# Patient Record
Sex: Female | Born: 1965 | Race: Black or African American | Hispanic: No | Marital: Married | Smoking: Never smoker
Health system: Southern US, Community
[De-identification: ages and names within clinical notes are randomized; demographics above are authoritative.]

## PROBLEM LIST (undated history)

## (undated) DIAGNOSIS — F419 Anxiety disorder, unspecified: Secondary | ICD-10-CM

## (undated) HISTORY — PX: BUNIONECTOMY: SHX129

## (undated) HISTORY — PX: DILATION AND CURETTAGE OF UTERUS: SHX78

---

## 1998-07-02 ENCOUNTER — Other Ambulatory Visit: Admission: RE | Admit: 1998-07-02 | Discharge: 1998-07-02 | Payer: Self-pay | Admitting: *Deleted

## 1999-07-04 ENCOUNTER — Other Ambulatory Visit: Admission: RE | Admit: 1999-07-04 | Discharge: 1999-07-04 | Payer: Self-pay | Admitting: *Deleted

## 2000-04-24 ENCOUNTER — Emergency Department (HOSPITAL_COMMUNITY): Admission: EM | Admit: 2000-04-24 | Discharge: 2000-04-24 | Payer: Self-pay | Admitting: Emergency Medicine

## 2000-04-25 ENCOUNTER — Encounter: Payer: Self-pay | Admitting: Emergency Medicine

## 2001-04-08 ENCOUNTER — Inpatient Hospital Stay (HOSPITAL_COMMUNITY): Admission: AD | Admit: 2001-04-08 | Discharge: 2001-04-12 | Payer: Self-pay | Admitting: *Deleted

## 2001-04-13 ENCOUNTER — Encounter: Admission: RE | Admit: 2001-04-13 | Discharge: 2001-05-13 | Payer: Self-pay | Admitting: *Deleted

## 2001-05-14 ENCOUNTER — Encounter: Admission: RE | Admit: 2001-05-14 | Discharge: 2001-06-13 | Payer: Self-pay | Admitting: *Deleted

## 2002-07-04 ENCOUNTER — Other Ambulatory Visit: Admission: RE | Admit: 2002-07-04 | Discharge: 2002-07-04 | Payer: Self-pay | Admitting: *Deleted

## 2003-08-11 ENCOUNTER — Other Ambulatory Visit: Admission: RE | Admit: 2003-08-11 | Discharge: 2003-08-11 | Payer: Self-pay | Admitting: *Deleted

## 2004-10-12 ENCOUNTER — Other Ambulatory Visit: Admission: RE | Admit: 2004-10-12 | Discharge: 2004-10-12 | Payer: Self-pay | Admitting: Obstetrics and Gynecology

## 2012-10-01 ENCOUNTER — Other Ambulatory Visit: Payer: Self-pay | Admitting: Obstetrics and Gynecology

## 2012-12-17 NOTE — Patient Instructions (Addendum)
   Your procedure is scheduled on:  Enter through the Main Entrance of [facility] at: Pick up the phone at the desk and dial [phone] and inform us of your arrival.  Please call this number if you have any problems the morning of surgery: [phone]  Remember: Do not eat food after midnight: Do not drink clear liquids after: Take these medicines the morning of surgery with a SIP OF WATER:  Do not wear jewelry, make-up, or FINGER nail polish No metal in your hair or on your body. Do not wear lotions, powders, perfumes. You may wear deodorant.  Please use your CHG wash as directed prior to surgery.  Do not shave anywhere for at least 12 hours prior to first CHG shower.  Do not bring valuables to the hospital. Contacts, dentures or bridgework may not be worn into surgery.  Leave suitcase in the car. After Surgery it may be brought to your room. For patients being admitted to the hospital, checkout time is 11:00am the day of discharge.  Patients discharged on the day of surgery will not be allowed to drive home.     Ashley Morales  12/19/2012   Your procedure is scheduled on:  01/06/13  Enter through the Main Entrance of [facility] at 6 AM.  Pick up the phone at the desk and dial [phone].   Call this number if you have problems the morning of surgery: [phone]   Remember:   Do not eat food:After Midnight.  Do not drink clear liquids: After Midnight.  Take these medicines the morning of surgery with A SIP OF WATER: May take Xanax if needed day of surgery or night before.   Do not wear jewelry, make-up or nail polish.  Do not wear lotions, powders, or perfumes. You may wear deodorant.  Do not shave 48 hours prior to surgery.  Do not bring valuables to the hospital.  Contacts, dentures or bridgework may not be worn into surgery.  Leave suitcase in the car. After surgery it may be brought to your room.  For patients admitted to the hospital, checkout time is  11:00 AM the day of discharge.   Patients discharged the day of surgery will not be allowed to drive home.  Name and phone number of your driver: NA  Special Instructions: Shower using CHG 2 nights before surgery and the night before surgery.  If you shower the day of surgery use CHG.  Use special wash - you have one bottle of CHG for all showers.  You should use approximately 1/3 of the bottle for each shower.   Please read over the following fact sheets that you were given: Surgical Site Infection Prevention

## 2012-12-18 ENCOUNTER — Encounter (HOSPITAL_COMMUNITY): Payer: Self-pay | Admitting: Pharmacist

## 2012-12-19 ENCOUNTER — Encounter (HOSPITAL_COMMUNITY): Payer: Self-pay

## 2012-12-19 ENCOUNTER — Encounter (HOSPITAL_COMMUNITY)
Admission: RE | Admit: 2012-12-19 | Discharge: 2012-12-19 | Disposition: A | Discharge status: Home / Self Care | Payer: Managed Care, Other (non HMO) | Source: Ambulatory Visit | Attending: Obstetrics and Gynecology | Admitting: Obstetrics and Gynecology

## 2012-12-19 DIAGNOSIS — Z01818 Encounter for other preprocedural examination: Secondary | ICD-10-CM | POA: Insufficient documentation

## 2012-12-19 DIAGNOSIS — Z01812 Encounter for preprocedural laboratory examination: Secondary | ICD-10-CM | POA: Insufficient documentation

## 2012-12-19 HISTORY — DX: Anxiety disorder, unspecified: F41.9

## 2012-12-19 LAB — CBC
HCT: 37.3 % (ref 36.0–46.0)
Hemoglobin: 12 g/dL (ref 12.0–15.0)
MCH: 29.5 pg (ref 26.0–34.0)
MCHC: 32.2 g/dL (ref 30.0–36.0)
MCV: 91.6 fL (ref 78.0–100.0)
Platelets: 374 10*3/uL (ref 150–400)
RBC: 4.07 MIL/uL (ref 3.87–5.11)
RDW: 14.2 % (ref 11.5–15.5)
WBC: 6.4 10*3/uL (ref 4.0–10.5)

## 2012-12-19 LAB — SURGICAL PCR SCREEN
MRSA, PCR: POSITIVE — AB
Staphylococcus aureus: POSITIVE — AB

## 2013-01-06 ENCOUNTER — Ambulatory Visit (HOSPITAL_COMMUNITY)
Admission: RE | Admit: 2013-01-06 | Payer: Managed Care, Other (non HMO) | Source: Ambulatory Visit | Admitting: Obstetrics and Gynecology

## 2013-01-06 ENCOUNTER — Encounter (HOSPITAL_COMMUNITY): Admission: RE | Payer: Self-pay | Source: Ambulatory Visit

## 2013-01-06 SURGERY — HYSTERECTOMY, VAGINAL, LAPAROSCOPY-ASSISTED
Anesthesia: General

## 2013-01-06 MED ORDER — CEFAZOLIN SODIUM-DEXTROSE 2-3 GM-% IV SOLR: 2.0000 g | INTRAVENOUS | Status: DC

## 2013-01-06 MED ORDER — LACTATED RINGERS IV SOLN: INTRAVENOUS | Status: DC

## 2013-01-06 MED ORDER — CEFAZOLIN SODIUM-DEXTROSE 2-3 GM-% IV SOLR
INTRAVENOUS | Status: AC
  Filled 2013-01-06: qty 50

## 2013-01-07 ENCOUNTER — Encounter (HOSPITAL_COMMUNITY): Payer: Self-pay | Admitting: Pharmacist

## 2013-01-08 ENCOUNTER — Ambulatory Visit (HOSPITAL_COMMUNITY): Payer: Managed Care, Other (non HMO) | Admitting: Anesthesiology

## 2013-01-08 ENCOUNTER — Inpatient Hospital Stay (HOSPITAL_COMMUNITY)
Admission: RE | Admit: 2013-01-08 | Discharge: 2013-01-10 | DRG: 742 | Disposition: A | Discharge status: Home / Self Care | Payer: Managed Care, Other (non HMO) | Source: Ambulatory Visit | Attending: Obstetrics and Gynecology | Admitting: Obstetrics and Gynecology

## 2013-01-08 ENCOUNTER — Encounter (HOSPITAL_COMMUNITY)
Admission: RE | Disposition: A | Discharge status: Home / Self Care | Payer: Self-pay | Source: Ambulatory Visit | Attending: Obstetrics and Gynecology

## 2013-01-08 ENCOUNTER — Encounter (HOSPITAL_COMMUNITY): Payer: Self-pay | Admitting: Anesthesiology

## 2013-01-08 ENCOUNTER — Inpatient Hospital Stay (HOSPITAL_COMMUNITY): Payer: Managed Care, Other (non HMO)

## 2013-01-08 DIAGNOSIS — R319 Hematuria, unspecified: Secondary | ICD-10-CM | POA: Diagnosis not present

## 2013-01-08 DIAGNOSIS — N920 Excessive and frequent menstruation with regular cycle: Secondary | ICD-10-CM | POA: Diagnosis present

## 2013-01-08 DIAGNOSIS — D219 Benign neoplasm of connective and other soft tissue, unspecified: Secondary | ICD-10-CM

## 2013-01-08 DIAGNOSIS — D250 Submucous leiomyoma of uterus: Principal | ICD-10-CM | POA: Diagnosis present

## 2013-01-08 DIAGNOSIS — N736 Female pelvic peritoneal adhesions (postinfective): Secondary | ICD-10-CM | POA: Diagnosis present

## 2013-01-08 DIAGNOSIS — Z5331 Laparoscopic surgical procedure converted to open procedure: Secondary | ICD-10-CM

## 2013-01-08 DIAGNOSIS — D251 Intramural leiomyoma of uterus: Secondary | ICD-10-CM | POA: Diagnosis present

## 2013-01-08 DIAGNOSIS — R188 Other ascites: Secondary | ICD-10-CM | POA: Diagnosis not present

## 2013-01-08 HISTORY — PX: ABDOMINAL HYSTERECTOMY: SHX81

## 2013-01-08 LAB — CBC
HCT: 30.1 % — ABNORMAL LOW (ref 36.0–46.0)
MCHC: 31.2 g/dL (ref 30.0–36.0)
MCHC: 32.7 g/dL (ref 30.0–36.0)
MCV: 92.9 fL (ref 78.0–100.0)
Platelets: 428 10*3/uL — ABNORMAL HIGH (ref 150–400)
RDW: 14.3 % (ref 11.5–15.5)
RDW: 14.2 % (ref 11.5–15.5)
WBC: 18.4 10*3/uL — ABNORMAL HIGH (ref 4.0–10.5)

## 2013-01-08 LAB — BASIC METABOLIC PANEL
BUN: 16 mg/dL (ref 6–23)
BUN: 15 mg/dL (ref 6–23)
CO2: 25 mEq/L (ref 19–32)
Calcium: 8.7 mg/dL (ref 8.4–10.5)
Chloride: 98 mEq/L (ref 96–112)
Creatinine, Ser: 0.76 mg/dL (ref 0.50–1.10)
Creatinine, Ser: 0.76 mg/dL (ref 0.50–1.10)
GFR calc Af Amer: >90 mL/min (ref >90)
GFR calc non Af Amer: >90 mL/min (ref >90)
Potassium: 3.8 mEq/L (ref 3.5–5.1)

## 2013-01-08 SURGERY — HYSTERECTOMY, ABDOMINAL
Anesthesia: General | Wound class: Clean Contaminated

## 2013-01-08 MED ORDER — LIDOCAINE HCL (CARDIAC) 20 MG/ML IV SOLN
INTRAVENOUS | Status: DC | PRN
  Administered 2013-01-08: 60 mg via INTRAVENOUS

## 2013-01-08 MED ORDER — KETOROLAC TROMETHAMINE 30 MG/ML IJ SOLN: 30.0000 mg | Freq: Once | INTRAMUSCULAR | Status: DC

## 2013-01-08 MED ORDER — MIDAZOLAM HCL 5 MG/5ML IJ SOLN
INTRAMUSCULAR | Status: DC | PRN
  Administered 2013-01-08: 1 mg via INTRAVENOUS

## 2013-01-08 MED ORDER — GLYCOPYRROLATE 0.2 MG/ML IJ SOLN
INTRAMUSCULAR | Status: DC | PRN
  Administered 2013-01-08: 0.6 mg via INTRAVENOUS

## 2013-01-08 MED ORDER — PROMETHAZINE HCL 25 MG/ML IJ SOLN: 6.2500 mg | INTRAMUSCULAR | Status: DC | PRN

## 2013-01-08 MED ORDER — DIPHENHYDRAMINE HCL 50 MG/ML IJ SOLN: 12.5000 mg | Freq: Four times a day (QID) | INTRAMUSCULAR | Status: DC | PRN

## 2013-01-08 MED ORDER — CEFAZOLIN SODIUM-DEXTROSE 2-3 GM-% IV SOLR
INTRAVENOUS | Status: AC
  Administered 2013-01-08: 2 g via INTRAVENOUS
  Filled 2013-01-08: qty 50

## 2013-01-08 MED ORDER — ACETAMINOPHEN 10 MG/ML IV SOLN
INTRAVENOUS | Status: AC
  Administered 2013-01-08: 1000 mg via INTRAVENOUS
  Filled 2013-01-08: qty 100

## 2013-01-08 MED ORDER — LACTATED RINGERS IV SOLN
INTRAVENOUS | Status: DC
  Administered 2013-01-08 (×3): via INTRAVENOUS

## 2013-01-08 MED ORDER — TRAMADOL HCL 50 MG PO TABS: 50.0000 mg | ORAL_TABLET | Freq: Four times a day (QID) | ORAL | Status: DC | PRN

## 2013-01-08 MED ORDER — HYDROMORPHONE HCL PF 1 MG/ML IJ SOLN
INTRAMUSCULAR | Status: AC
  Administered 2013-01-08: 0.5 mg via INTRAVENOUS
  Filled 2013-01-08: qty 1

## 2013-01-08 MED ORDER — METOCLOPRAMIDE HCL 5 MG/ML IJ SOLN
INTRAMUSCULAR | Status: AC
  Filled 2013-01-08: qty 2

## 2013-01-08 MED ORDER — ACETAMINOPHEN 10 MG/ML IV SOLN: 1000.0000 mg | Freq: Once | INTRAVENOUS | Status: DC

## 2013-01-08 MED ORDER — SCOPOLAMINE 1 MG/3DAYS TD PT72
MEDICATED_PATCH | TRANSDERMAL | Status: AC
  Administered 2013-01-08: 1.5 mg via TRANSDERMAL
  Filled 2013-01-08: qty 1

## 2013-01-08 MED ORDER — TEMAZEPAM 15 MG PO CAPS: 15.0000 mg | ORAL_CAPSULE | Freq: Every evening | ORAL | Status: DC | PRN

## 2013-01-08 MED ORDER — IBUPROFEN 600 MG PO TABS: 600.0000 mg | ORAL_TABLET | Freq: Four times a day (QID) | ORAL | Status: DC | PRN

## 2013-01-08 MED ORDER — DEXAMETHASONE SODIUM PHOSPHATE 10 MG/ML IJ SOLN
INTRAMUSCULAR | Status: AC
  Filled 2013-01-08: qty 1

## 2013-01-08 MED ORDER — HYDROMORPHONE 0.3 MG/ML IV SOLN
INTRAVENOUS | Status: DC
  Administered 2013-01-08: 15:00:00 via INTRAVENOUS
  Administered 2013-01-08: 0.799 mg via INTRAVENOUS
  Administered 2013-01-08: 0.4 mg via INTRAVENOUS
  Administered 2013-01-09: 0.399 mg via INTRAVENOUS
  Administered 2013-01-09: 0.3 mg via INTRAVENOUS
  Administered 2013-01-09: 0.2 mg via INTRAVENOUS
  Filled 2013-01-08: qty 25

## 2013-01-08 MED ORDER — ALPRAZOLAM 0.25 MG PO TABS: 0.2500 mg | ORAL_TABLET | Freq: Three times a day (TID) | ORAL | Status: DC | PRN

## 2013-01-08 MED ORDER — DEXTROSE IN LACTATED RINGERS 5 % IV SOLN
INTRAVENOUS | Status: DC
  Administered 2013-01-08 – 2013-01-09 (×3): via INTRAVENOUS

## 2013-01-08 MED ORDER — NEOSTIGMINE METHYLSULFATE 1 MG/ML IJ SOLN
INTRAMUSCULAR | Status: AC
  Filled 2013-01-08: qty 1

## 2013-01-08 MED ORDER — FENTANYL CITRATE 0.05 MG/ML IJ SOLN
INTRAMUSCULAR | Status: AC
  Filled 2013-01-08: qty 5

## 2013-01-08 MED ORDER — PROMETHAZINE HCL 25 MG/ML IJ SOLN
INTRAMUSCULAR | Status: AC
  Administered 2013-01-08: 6.25 mg via INTRAVENOUS
  Filled 2013-01-08: qty 1

## 2013-01-08 MED ORDER — PROPOFOL 10 MG/ML IV EMUL
INTRAVENOUS | Status: AC
  Filled 2013-01-08: qty 20

## 2013-01-08 MED ORDER — LACTATED RINGERS IV BOLUS (SEPSIS): 500.0000 mL | Freq: Once | INTRAVENOUS | Status: DC

## 2013-01-08 MED ORDER — HYDROMORPHONE HCL PF 1 MG/ML IJ SOLN
0.2500 mg | INTRAMUSCULAR | Status: DC | PRN
  Administered 2013-01-08: 0.5 mg via INTRAVENOUS

## 2013-01-08 MED ORDER — GLYCOPYRROLATE 0.2 MG/ML IJ SOLN
INTRAMUSCULAR | Status: AC
  Filled 2013-01-08: qty 4

## 2013-01-08 MED ORDER — LIDOCAINE HCL (CARDIAC) 20 MG/ML IV SOLN
INTRAVENOUS | Status: AC
  Filled 2013-01-08: qty 5

## 2013-01-08 MED ORDER — ROCURONIUM BROMIDE 100 MG/10ML IV SOLN
INTRAVENOUS | Status: DC | PRN
  Administered 2013-01-08: 35 mg via INTRAVENOUS

## 2013-01-08 MED ORDER — 0.9 % SODIUM CHLORIDE (POUR BTL) OPTIME
TOPICAL | Status: DC | PRN
  Administered 2013-01-08: 1000 mL

## 2013-01-08 MED ORDER — ONDANSETRON HCL 4 MG/2ML IJ SOLN
INTRAMUSCULAR | Status: DC | PRN
  Administered 2013-01-08: 4 mg via INTRAVENOUS

## 2013-01-08 MED ORDER — PHENYLEPHRINE 40 MCG/ML (10ML) SYRINGE FOR IV PUSH (FOR BLOOD PRESSURE SUPPORT)
PREFILLED_SYRINGE | INTRAVENOUS | Status: AC
  Filled 2013-01-08: qty 5

## 2013-01-08 MED ORDER — METOCLOPRAMIDE HCL 5 MG/ML IJ SOLN
INTRAMUSCULAR | Status: DC | PRN
  Administered 2013-01-08: 10 mg via INTRAVENOUS

## 2013-01-08 MED ORDER — ONDANSETRON HCL 4 MG/2ML IJ SOLN
INTRAMUSCULAR | Status: AC
  Filled 2013-01-08: qty 2

## 2013-01-08 MED ORDER — DIPHENHYDRAMINE HCL 12.5 MG/5ML PO ELIX: 12.5000 mg | ORAL_SOLUTION | Freq: Four times a day (QID) | ORAL | Status: DC | PRN

## 2013-01-08 MED ORDER — MENTHOL 3 MG MT LOZG: 1.0000 | LOZENGE | OROMUCOSAL | Status: DC | PRN

## 2013-01-08 MED ORDER — NEOSTIGMINE METHYLSULFATE 1 MG/ML IJ SOLN
INTRAMUSCULAR | Status: DC | PRN
  Administered 2013-01-08: 3 mg via INTRAVENOUS

## 2013-01-08 MED ORDER — SODIUM CHLORIDE 0.9 % IJ SOLN: 9.0000 mL | INTRAMUSCULAR | Status: DC | PRN

## 2013-01-08 MED ORDER — PROPOFOL 10 MG/ML IV EMUL
INTRAVENOUS | Status: DC | PRN
  Administered 2013-01-08: 200 mg via INTRAVENOUS

## 2013-01-08 MED ORDER — BUPIVACAINE HCL (PF) 0.25 % IJ SOLN
INTRAMUSCULAR | Status: AC
  Filled 2013-01-08: qty 30

## 2013-01-08 MED ORDER — NALOXONE HCL 0.4 MG/ML IJ SOLN: 0.4000 mg | INTRAMUSCULAR | Status: DC | PRN

## 2013-01-08 MED ORDER — MIDAZOLAM HCL 2 MG/2ML IJ SOLN
INTRAMUSCULAR | Status: AC
  Filled 2013-01-08: qty 2

## 2013-01-08 MED ORDER — BUPIVACAINE HCL (PF) 0.25 % IJ SOLN
INTRAMUSCULAR | Status: DC | PRN
  Administered 2013-01-08: 15 mL

## 2013-01-08 MED ORDER — DEXAMETHASONE SODIUM PHOSPHATE 4 MG/ML IJ SOLN
INTRAMUSCULAR | Status: DC | PRN
  Administered 2013-01-08: 8 mg via INTRAVENOUS

## 2013-01-08 MED ORDER — SCOPOLAMINE 1 MG/3DAYS TD PT72: 1.0000 | MEDICATED_PATCH | TRANSDERMAL | Status: DC

## 2013-01-08 MED ORDER — ONDANSETRON HCL 4 MG/2ML IJ SOLN
4.0000 mg | Freq: Four times a day (QID) | INTRAMUSCULAR | Status: DC | PRN
  Administered 2013-01-08 – 2013-01-10 (×2): 4 mg via INTRAVENOUS
  Filled 2013-01-08 (×2): qty 2

## 2013-01-08 MED ORDER — FENTANYL CITRATE 0.05 MG/ML IJ SOLN
INTRAMUSCULAR | Status: DC | PRN
  Administered 2013-01-08: 50 ug via INTRAVENOUS
  Administered 2013-01-08 (×2): 100 ug via INTRAVENOUS
  Administered 2013-01-08: 50 ug via INTRAVENOUS
  Administered 2013-01-08: 100 ug via INTRAVENOUS
  Administered 2013-01-08: 50 ug via INTRAVENOUS

## 2013-01-08 MED ORDER — ROCURONIUM BROMIDE 50 MG/5ML IV SOLN
INTRAVENOUS | Status: AC
  Filled 2013-01-08: qty 1

## 2013-01-08 SURGICAL SUPPLY — 42 items
BARRIER ADHS 3X4 INTERCEED (GAUZE/BANDAGES/DRESSINGS) IMPLANT
CABLE HIGH FREQUENCY MONO STRZ (ELECTRODE) IMPLANT
CHLORAPREP W/TINT 26ML (MISCELLANEOUS) ×2 IMPLANT
CLOTH BEACON ORANGE TIMEOUT ST (SAFETY) ×2 IMPLANT
CONT PATH 16OZ SNAP LID 3702 (MISCELLANEOUS) ×2 IMPLANT
COVER MAYO STAND STRL (DRAPES) ×2 IMPLANT
COVER TABLE BACK 60X90 (DRAPES) ×2 IMPLANT
DECANTER SPIKE VIAL GLASS SM (MISCELLANEOUS) IMPLANT
DERMABOND ADVANCED (GAUZE/BANDAGES/DRESSINGS) ×1
DERMABOND ADVANCED .7 DNX12 (GAUZE/BANDAGES/DRESSINGS) ×1 IMPLANT
DRSG OPSITE POSTOP 4X10 (GAUZE/BANDAGES/DRESSINGS) ×2 IMPLANT
ELECT REM PT RETURN 9FT ADLT (ELECTROSURGICAL) ×2
ELECTRODE REM PT RTRN 9FT ADLT (ELECTROSURGICAL) ×1 IMPLANT
EVACUATOR SMOKE 8.L (FILTER) IMPLANT
GLOVE BIO SURGEON STRL SZ 6.5 (GLOVE) ×2 IMPLANT
GLOVE BIOGEL PI IND STRL 6.5 (GLOVE) ×1 IMPLANT
GLOVE BIOGEL PI INDICATOR 6.5 (GLOVE) ×1
GOWN STRL REIN XL XLG (GOWN DISPOSABLE) ×8 IMPLANT
NEEDLE INSUFFLATION 120MM (ENDOMECHANICALS) ×2 IMPLANT
NS IRRIG 1000ML POUR BTL (IV SOLUTION) ×2 IMPLANT
PACK LAVH (CUSTOM PROCEDURE TRAY) ×2 IMPLANT
PROTECTOR NERVE ULNAR (MISCELLANEOUS) ×2 IMPLANT
SEALER TISSUE G2 CVD JAW 45CM (ENDOMECHANICALS) IMPLANT
SET IRRIG TUBING LAPAROSCOPIC (IRRIGATION / IRRIGATOR) IMPLANT
SOLUTION ELECTROLUBE (MISCELLANEOUS) IMPLANT
SPONGE LAP 18X18 X RAY DECT (DISPOSABLE) ×6 IMPLANT
STAPLER VISISTAT 35W (STAPLE) ×2 IMPLANT
SUT PLAIN 2 0 (SUTURE) ×1
SUT PLAIN ABS 2-0 CT1 27XMFL (SUTURE) ×1 IMPLANT
SUT VIC AB 0 CT1 18XCR BRD8 (SUTURE) ×2 IMPLANT
SUT VIC AB 0 CT1 36 (SUTURE) ×6 IMPLANT
SUT VIC AB 0 CT1 8-18 (SUTURE) ×2
SUT VIC AB 3-0 PS2 18 (SUTURE)
SUT VIC AB 3-0 PS2 18XBRD (SUTURE) IMPLANT
SUT VICRYL 0 TIES 12 18 (SUTURE) ×2 IMPLANT
SUT VICRYL 0 UR6 27IN ABS (SUTURE) ×2 IMPLANT
TOWEL OR 17X24 6PK STRL BLUE (TOWEL DISPOSABLE) ×4 IMPLANT
TRAY FOLEY CATH 14FR (SET/KITS/TRAYS/PACK) ×2 IMPLANT
TROCAR OPTI TIP 5M 100M (ENDOMECHANICALS) ×2 IMPLANT
TROCAR XCEL DIL TIP R 11M (ENDOMECHANICALS) ×2 IMPLANT
WARMER LAPAROSCOPE (MISCELLANEOUS) ×2 IMPLANT
WATER STERILE IRR 1000ML POUR (IV SOLUTION) ×2 IMPLANT

## 2013-01-08 NOTE — Progress Notes (Signed)
Ur chart review completed.  

## 2013-01-08 NOTE — OR Nursing (Signed)
1245 dr Vincente Poli comes to assess pt. Pt stable  Vital signs stable. Orders received to draw cbc and bmet. Pt alert oriented.

## 2013-01-08 NOTE — Progress Notes (Signed)
Came to evaluate patient in recovery room. Urine changed from clear to cherry color in the recovery room. It was clear in the OR Patient has had excellent response to the fluid bolus and continues to have very stable vital signs She reports normal amount of pain and minimal nausea  Exam Abdomen is soft and slightly distended Not rigid Incision is clear and dry no oozing with application of pressure No vaginal bleeding uop continues to be a cherry color - very watery  Impression; Hematuria post op Hemodynamically stable i would like to get a stat cbc and cmet Fluid bolus Foley already changed Monitor closely No evidence of intraperitoneal bleed will look at cbc though

## 2013-01-08 NOTE — Progress Notes (Signed)
Today, the patient is stable. Pain 8 out of 10 Afebrile VSS UOP is great since and is less red now but still pink  Abdomen is slightly distended Incision is clean dry and intact Some oozing from the infraumbilical site but i cannot express any drainage.   Hemoglobin is stable at 9.1 Which was 9.4 at lunchtime  Ultrasound at bedside Small amount of fluid along the RUQ but not a lot  Difficult to visualize any fluid in the cul de sac No vaginal bleeding  IMPRESSION: Post op hematuria Stable hemoglobin  PLAN: Monitor uop Ultrasound now to check the ureters BMET

## 2013-01-08 NOTE — OR Nursing (Signed)
10:25 pt urine went from lt pink tinged to cherry in color. Pt vital signs stable. Pt resting quielty.  250cc urine emptied from pt foley bag. Called to dr. Vincente Poli to inform of change of urine color. Orders received to give 500cc ns bolus iv and irrigate pt cathetor with normal saline. Call dr Vincente Poli to let know status after orders carried out. Margarita Mail rn

## 2013-01-08 NOTE — Transfer of Care (Signed)
Immediate Anesthesia Transfer of Care Note  Patient: Ashley Morales  Procedure(s) Performed: Procedure(s): HYSTERECTOMY ABDOMINAL (N/A)  Patient Location: PACU  Anesthesia Type:General  Level of Consciousness: awake, alert  and patient cooperative  Airway & Oxygen Therapy: Patient Spontanous Breathing and Patient connected to nasal cannula oxygen  Post-op Assessment: Report given to PACU RN and Post -op Vital signs reviewed and stable  Post vital signs: stable  Complications: No apparent anesthesia complications

## 2013-01-08 NOTE — Brief Op Note (Signed)
01/08/2013  8:50 AM  PATIENT:  Ashley Morales  47 y.o. female  PRE-OPERATIVE DIAGNOSIS:  Fibroids  POST-OPERATIVE DIAGNOSIS:  Fibroids, dense pelvic adhesions  PROCEDURE:  Procedure(s): HYSTERECTOMY ABDOMINAL (N/A) Diagnostic Laparoscopy Lysis of adhesions   SURGEON:  Surgeon(s) and Role:    * Jeani Hawking, MD - Primary    * W Varney Baas, MD - Assisting  PHYSICIAN ASSISTANT:   ASSISTANTS: none   ANESTHESIA:   general  EBL:  Total I/O In: 2000 [I.V.:2000] Out: 325 [Urine:125; Blood:200]  BLOOD ADMINISTERED:none  DRAINS: Urinary Catheter (Foley)   LOCAL MEDICATIONS USED:  XYLOCAINE   SPECIMEN:  Source of Specimen:  uterus and cervix  DISPOSITION OF SPECIMEN:  PATHOLOGY  COUNTS:  YES  TOURNIQUET:  * No tourniquets in log *  DICTATION: .Other Dictation: Dictation Number [id number]  PLAN OF CARE: Admit to inpatient   PATIENT DISPOSITION:  PACU - hemodynamically stable.   Delay start of Pharmacological VTE agent (>24hrs) due to surgical blood loss or risk of bleeding: not applicable

## 2013-01-08 NOTE — Anesthesia Postprocedure Evaluation (Signed)
Anesthesia Post Note  Patient: Ashley Morales  Procedure(s) Performed: Procedure(s) (LRB): HYSTERECTOMY ABDOMINAL (N/A)  Anesthesia type: General  Patient location: PACU  Post pain: Pain level controlled  Post assessment: Post-op Vital signs reviewed  Last Vitals:  Filed Vitals:   01/08/13 0615  BP: 110/66  Pulse: 71  Temp: 36.5 C    Post vital signs: Reviewed  Level of consciousness: sedated  Complications: No apparent anesthesia complications

## 2013-01-08 NOTE — Progress Notes (Signed)
47 year old G 2 P 2  with symptomatic fibroids. Complains of menorrhagia. Ultrasound is consistent with multiple fibroids. 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

## 2013-01-08 NOTE — Anesthesia Preprocedure Evaluation (Signed)

## 2013-01-09 ENCOUNTER — Encounter (HOSPITAL_COMMUNITY): Payer: Self-pay | Admitting: *Deleted

## 2013-01-09 LAB — BASIC METABOLIC PANEL
GFR calc Af Amer: >90 mL/min (ref >90)
GFR calc non Af Amer: >90 mL/min (ref >90)
Potassium: 4.4 mEq/L (ref 3.5–5.1)
Sodium: 137 mEq/L (ref 135–145)

## 2013-01-09 LAB — CBC
HCT: 21.6 % — ABNORMAL LOW (ref 36.0–46.0)
MCH: 29.3 pg (ref 26.0–34.0)
MCV: 90.4 fL (ref 78.0–100.0)
Platelets: 309 10*3/uL (ref 150–400)
RBC: 2.39 MIL/uL — ABNORMAL LOW (ref 3.87–5.11)
WBC: 11.9 10*3/uL — ABNORMAL HIGH (ref 4.0–10.5)

## 2013-01-09 MED ORDER — GLYCERIN (LAXATIVE) 2.1 G RE SUPP
1.0000 | Freq: Once | RECTAL | Status: AC
  Administered 2013-01-09: 21:00:00 via RECTAL
  Filled 2013-01-09 (×2): qty 1

## 2013-01-09 MED ORDER — FERUMOXYTOL INJECTION 510 MG/17 ML
510.0000 mg | Freq: Once | INTRAVENOUS | Status: AC
  Administered 2013-01-09: 510 mg via INTRAVENOUS
  Filled 2013-01-09: qty 17

## 2013-01-09 MED ORDER — IBUPROFEN 100 MG/5ML PO SUSP
600.0000 mg | Freq: Four times a day (QID) | ORAL | Status: DC | PRN
  Administered 2013-01-10: 600 mg via ORAL
  Filled 2013-01-09: qty 30

## 2013-01-09 MED ORDER — OXYCODONE HCL 5 MG/5ML PO SOLN
10.0000 mg | ORAL | Status: DC | PRN
  Administered 2013-01-09 – 2013-01-10 (×2): 10 mg via ORAL
  Filled 2013-01-09 (×2): qty 10

## 2013-01-09 MED ORDER — HYDROMORPHONE HCL 1 MG/ML PO LIQD: 2.0000 mg | ORAL | Status: DC | PRN

## 2013-01-09 NOTE — Progress Notes (Signed)
1 Day Post-Op Procedure(s) (LRB): HYSTERECTOMY ABDOMINAL (N/A)  Subjective: Patient reports incisional pain, tolerating PO and + flatus.    Objective: I have reviewed patient's vital signs, intake and output, medications and labs.  Urine output is clear  General: alert, cooperative and appears stated age Vaginal Bleeding: none Abdomen is soft and non tender and incision is clean, dry and intact  Assessment: s/p Procedure(s): HYSTERECTOMY ABDOMINAL (N/A): progressing well and tolerating diet  Plan: Advance diet Encourage ambulation Advance to PO medication Discontinue IV fluids Continue foley due to urinary output monitoring cause of hematuria post op not known  - may have been secondary to extensive dissection around bladder. I would like for patient to wear Foley for one week and I discussed this with the patient  LOS: 1 day    Ashley Morales L 01/09/2013, 9:56 AM

## 2013-01-09 NOTE — Op Note (Signed)
NAMEKEIRA, BOHLIN         ACCOUNT NO.:  [id number]  MEDICAL RECORD NO.:  [id number]  LOCATION:  9305                          FACILITY:  [facility]  PHYSICIAN:  Lorimer Tiberio L. Yifan Auker, M.D.DATE OF BIRTH:  10/18/1965  DATE OF PROCEDURE:  01/08/2013 DATE OF DISCHARGE:                              OPERATIVE REPORT   PREOPERATIVE DIAGNOSIS:  Symptomatic fibroids.  POSTOPERATIVE DIAGNOSES: 1. Symptomatic fibroids. 2. Dense pelvic adhesions.  PROCEDURES: 1. Diagnostic laparoscopy with conversion to total abdominal     hysterectomy. 2. Extensive lysis of adhesions.  SURGEON:  Isbella Arline L. Vincente Poli, MD  ASSISTANT:  Freddy Finner, MD  ANESTHESIA:  General.  ESTIMATED BLOOD LOSS:  200 mL.  URINE OUTPUT:  125 mL, clear urine.  DESCRIPTION OF PROCEDURE:  The patient was taken to the operating room after she had been consented about all the risk associated with the procedure.  She is well aware of the possibility of laparotomy because of 2 prior cesarean sections and the size of her uterus.  After she was intubated, she was prepped and draped.  A uterine manipulator was inserted.  Attention was turned to the abdomen.  A Foley catheter also had been inserted and was draining clear urine.  A small infraumbilical incision was made.  A Veress needle was inserted and pneumoperitoneum was performed.  The trocar was then inserted and with excellent visualization, we could see that the uterus was markedly enlarged and myomatous.  Also, that the entire anterior surface of the uterus was densely adherent to the abdominal wall, most likely from her previous cesarean section.  I felt that there was no way that we would be able to safely take this adhesion down laparoscopically, so we converted to TAH. We removed the trocar, released the pneumoperitoneum, closed the incision with Dermabond.  Then, we made a small Pfannenstiel incision at the area of the previous C-section scar, carried it down to  the fascia. There was dense scar tissue in the subcutaneous layer.  We carefully went down to the fascia, scored that with a knife, and entered the fascia.  We then dissected the rectus muscles away sharply using Mayo scissors superiorly and inferiorly.  We could find a defect where I had inserted the trocar and so we our placed 2 fingers in there and we were able to take down a lot of adhesions sharply with careful attention. Once we saw the fundus, we grasped that with a towel clamp and pulled that out and we were able to use some traction and pulled these adhesions down with sharp and blunt dissection.  The adnexa were normal. What I did then is, I have then placed curved Heaney clamps across the triple pedicle on the right side, clamped that, cut it, and suture ligated x2 with 0 Vicryl suture.  We did this on the right and then on the left.  We then took down some of the adhesions sharply.  We were then unable to identify the uterine artery and clamped at the level of the internal os bilaterally.  Each clamp was clamped, cut, and suture ligated using 0 Vicryl suture.  We then amputated the fundus to make it easier to work on the cervix.  This was done easily with a knife.  We then used sharp dissection.  We were able to identify the bladder.  We dissected out away from the anterior surface of the cervix, placed straight Heaney clamps along the cervix.  Each pedicle was clamped, cut, and suture ligated.  We walked our way down the cervix, with each clamps staying snug beside the cervix.  Each pedicle was clamped, cut, and suture ligated using 0 Vicryl suture.  Once we reached the external os and noted her bladder was well below this.  We then placed curved Heaney clamps across that.  The cervix was then removed.  The angle stitches were placed using 0 Vicryl suture.  Remainder of the cervix was closed using 0 Vicryl in interrupted.  Hemostasis was excellent.  Irrigation was performed.   Hemostasis was again noted.  The peritoneum and rectus muscles were reapproximated using 0 Vicryl interrupted.  The fascia was closed using 0 Vicryl, starting at a corner in running stitch.  After irrigation of the subcutaneous layer, the subcu was closed with plain gut interrupted.  The skin was closed with staples.  All sponge, lap, instrument counts were correct x2.  The patient went to the recovery room in stable condition.     Chris Cripps L. Vincente Poli, M.D.     Florestine Avers  D:  01/08/2013  T:  01/09/2013  Job:  [id number]

## 2013-01-10 LAB — CBC
MCH: 29.7 pg (ref 26.0–34.0)
MCHC: 32.2 g/dL (ref 30.0–36.0)
MCV: 92.2 fL (ref 78.0–100.0)
Platelets: 317 10*3/uL (ref 150–400)
RDW: 14.5 % (ref 11.5–15.5)

## 2013-01-10 LAB — BASIC METABOLIC PANEL
BUN: 10 mg/dL (ref 6–23)
CO2: 30 mEq/L (ref 19–32)
Calcium: 8.6 mg/dL (ref 8.4–10.5)
Creatinine, Ser: 0.7 mg/dL (ref 0.50–1.10)
GFR calc non Af Amer: >90 mL/min (ref >90)
Glucose, Bld: 111 mg/dL — ABNORMAL HIGH (ref 70–99)

## 2013-01-10 MED ORDER — IBUPROFEN 100 MG/5ML PO SUSP: 600.0000 mg | Freq: Four times a day (QID) | ORAL | Status: DC | PRN

## 2013-01-10 MED ORDER — GLYCERIN (LAXATIVE) 2.1 G RE SUPP
1.0000 | Freq: Once | RECTAL | Status: AC
  Administered 2013-01-10: 1 via RECTAL
  Filled 2013-01-10: qty 1

## 2013-01-10 MED ORDER — ONDANSETRON 8 MG PO TBDP: 8.0000 mg | ORAL_TABLET | Freq: Three times a day (TID) | ORAL | Status: DC | PRN

## 2013-01-10 MED ORDER — MAGNESIUM HYDROXIDE 400 MG/5ML PO SUSP
15.0000 mL | Freq: Every day | ORAL | Status: DC | PRN
  Administered 2013-01-10: 15 mL via ORAL
  Filled 2013-01-10: qty 30

## 2013-01-10 MED ORDER — HYDROMORPHONE HCL 1 MG/ML PO LIQD: 2.0000 mg | ORAL | Status: DC | PRN

## 2013-01-10 NOTE — Progress Notes (Signed)
2 Days Post-Op Procedure(s) (LRB): HYSTERECTOMY ABDOMINAL (N/A)  Subjective: Patient reports tolerating PO and + flatus.  Is passing a lot of gas. Tolerating regular diet.  Objective: I have reviewed patient's vital signs, intake and output, medications and labs.  General: alert, cooperative and appears stated age Vaginal Bleeding: none Abdomen is slightly distended and non tender Great bowel sounds  Assessment: s/p Procedure(s): HYSTERECTOMY ABDOMINAL (N/A): stable and progressing well  Plan: Advance diet Discharge home Dulcolax suppository today Discharge home with Foley and leg bag  Will return on Thursday to have catheter and staples removed.  LOS: 2 days    Ashley Morales 01/10/2013, 8:25 AM

## 2013-01-10 NOTE — Discharge Summary (Signed)
Admission Diagnosis: Symptomatic Fibroids  Discharge Diagnosis: Same  Hospital Course:  47 year old underwent Diagnostic laparoscopy, TAH and extensive lysis of adhesions. At the time of completion of the surgery, the urine in the Foley was clear. However, when the patient arrived to the recovery room, her urine was cherry colored. Urine output and vital signs were always excellent. The urine started to lighten up about 10 hours after surgery. During that time, we monitored the patient and CBC and CMET were both stable. A bedside ultrasound was performed which showed minimal fluid in the abdominal cavity and normal size ureters. The etiology of the hematuria was unclear and believed to be either a small incidental cystotomy or from the trauma to the bladder from the extensive dissection that was necessary. After 10 hours, the urine remained clear and drainage was always excellent. I felt that wearing the Foley for one week was necessary to allow the bladder time to heal.  On POD #1, the hemoglobin was 7 a small drop from POD #0. I do think the patient had some post op oozing but she remained very stable. We advanced her diet very slowly because of concern for an ileus. By POD #1 she was passing a lot of flatus. Her pain had decreased.   She was discharged home in good condition on POD #2. She was given very strict ileus precautions. She was instructed on Foley care. She will return to my office on Thursday to remove the Foley and the staples.  She was given po Dilaudid, Ibuprofen and Zofran to use prn  No driving for 1 week No intercourse for 6 weeks.

## 2013-11-11 ENCOUNTER — Other Ambulatory Visit: Payer: Self-pay | Admitting: Obstetrics and Gynecology

## 2014-05-04 IMAGING — US US ABDOMEN LIMITED
1 series · 14 of 20 positions shown · non-contrast
Comparison: None.

CLINICAL DATA: Hematuria after abdominal hysterectomy earlier
today.

LIMITED ABDOMINAL ULTRASOUND

[Series 1: us abdomen complete · 20 acquisitions, 14 frames shown]
[im 1/20]
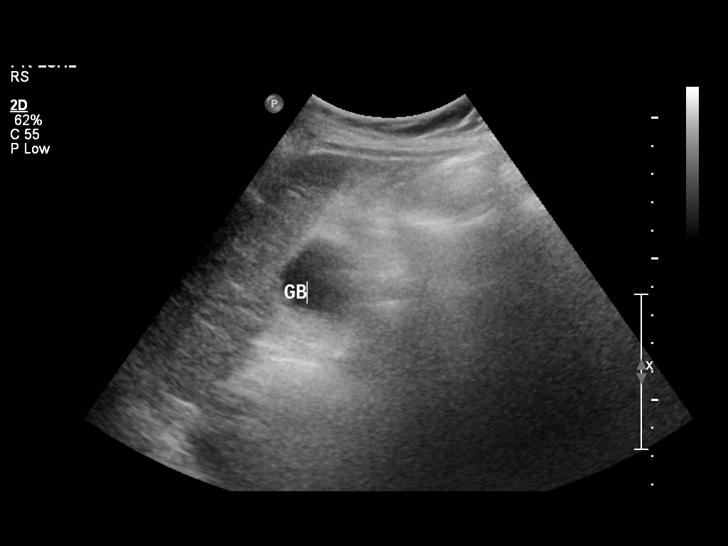
[im 3/20]
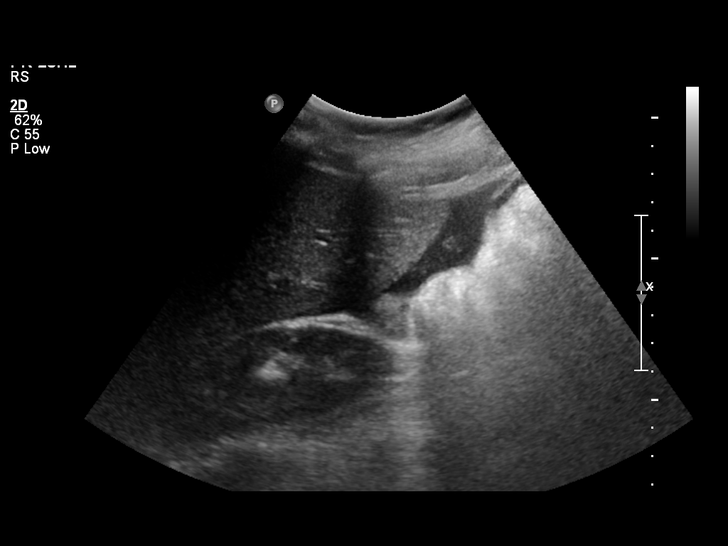
[im 4/20]
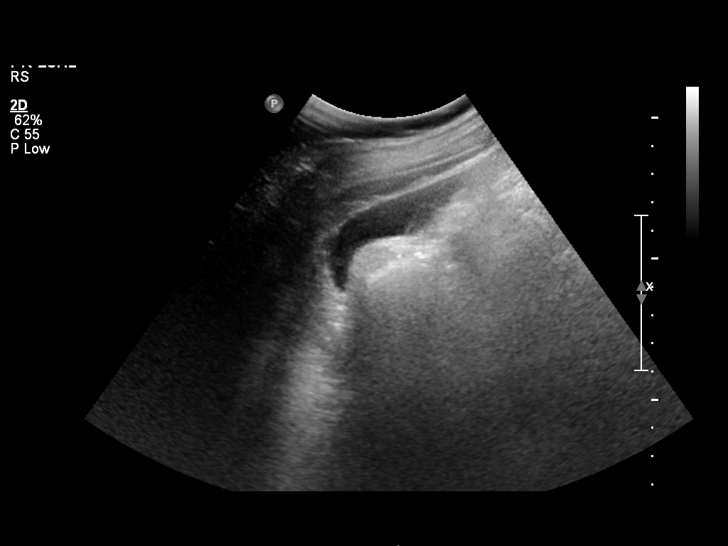
[im 6/20]
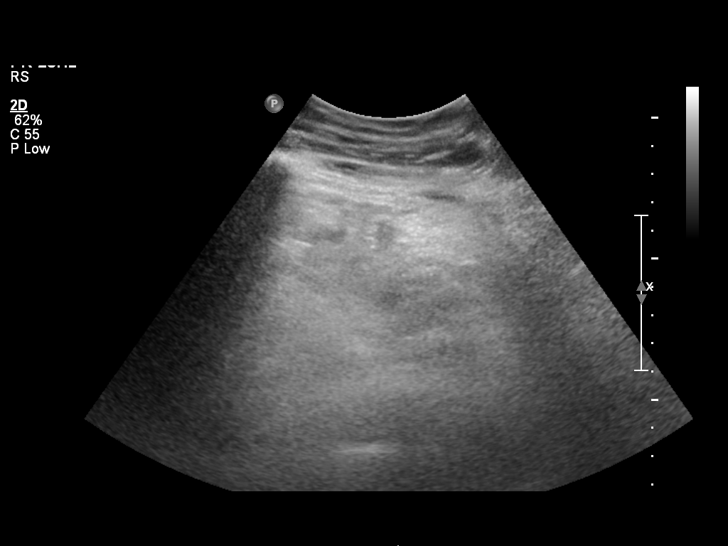
[im 7/20]
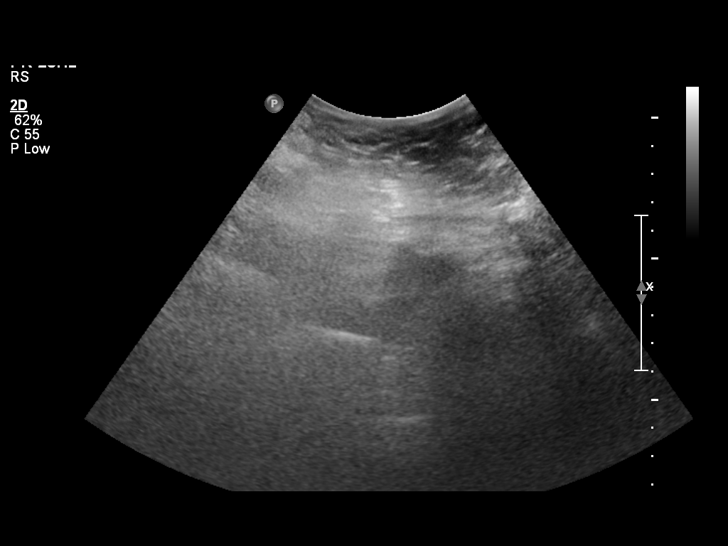
[im 8/20]
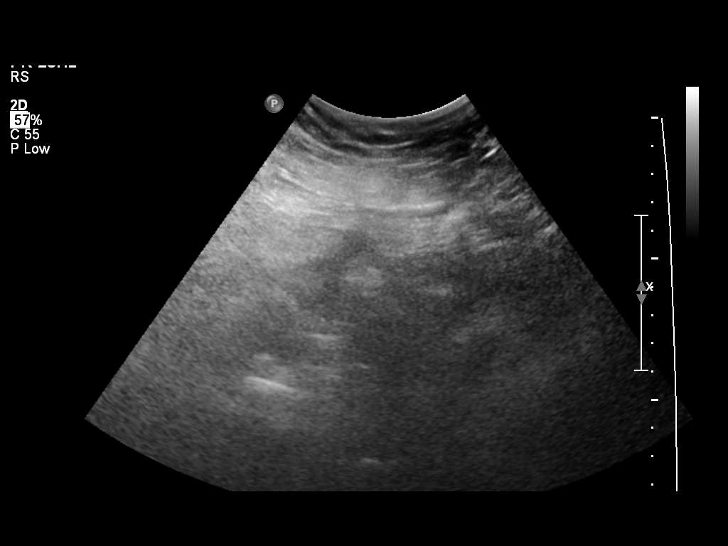
[im 10/20]
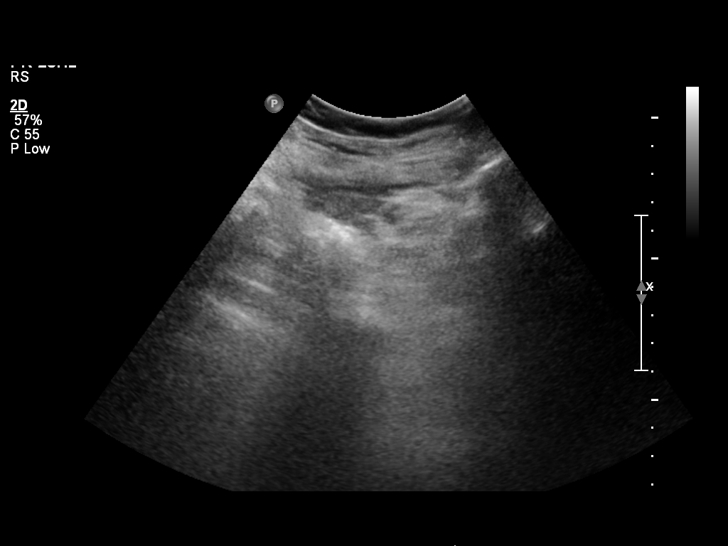
[im 11/20]
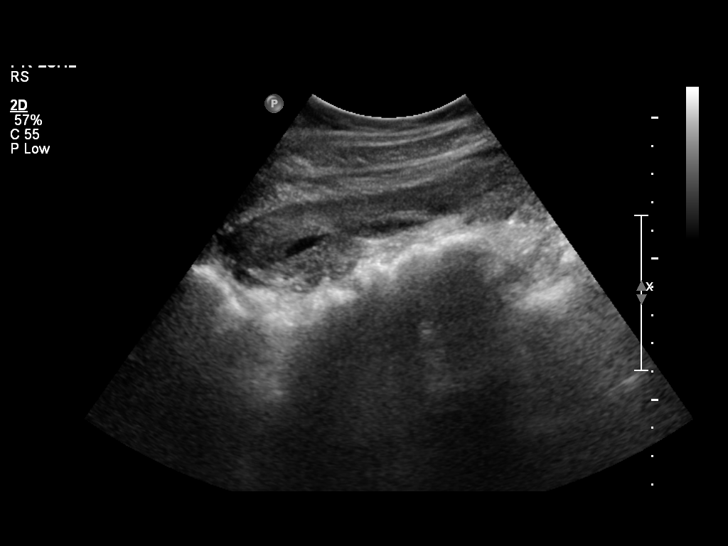
[im 13/20]
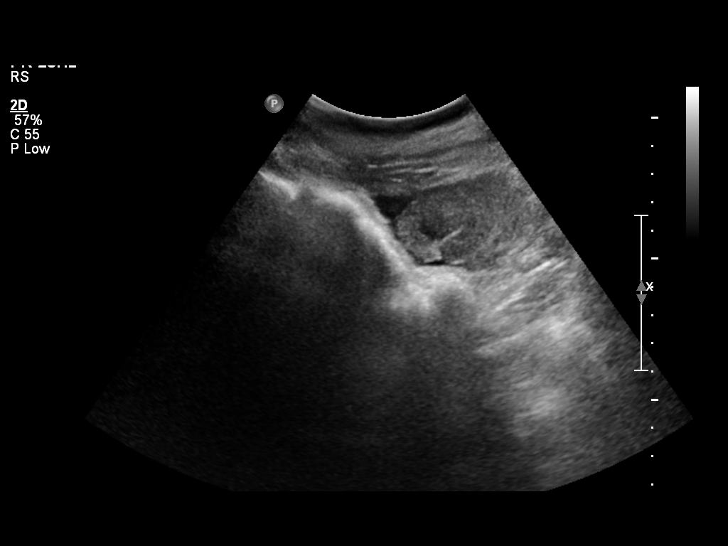
[im 14/20]
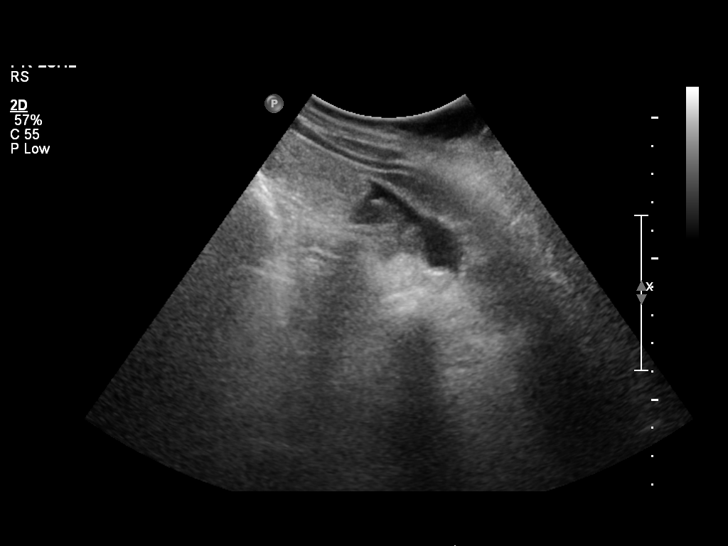
[im 16/20]
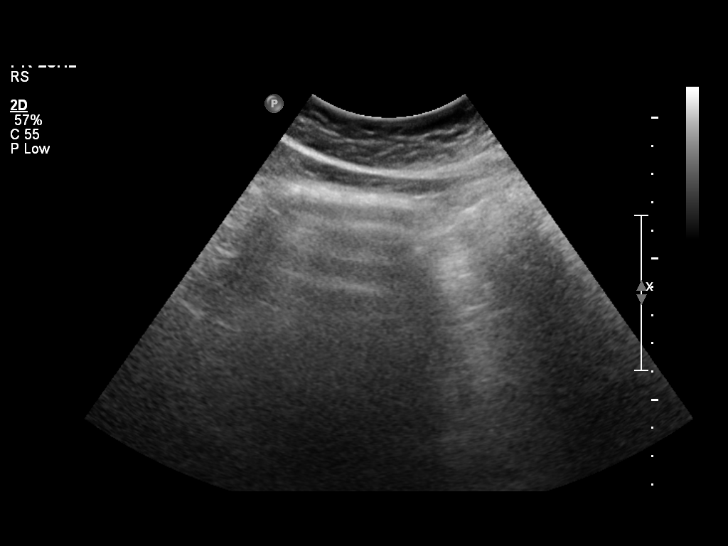
[im 17/20]
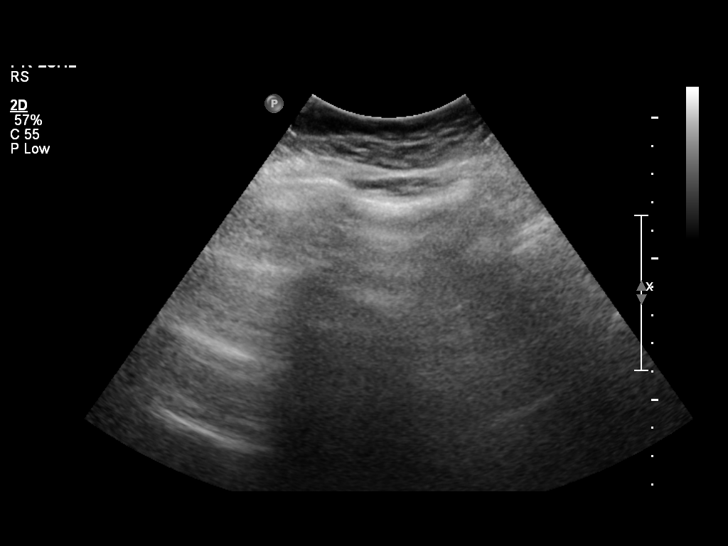
[im 18/20]
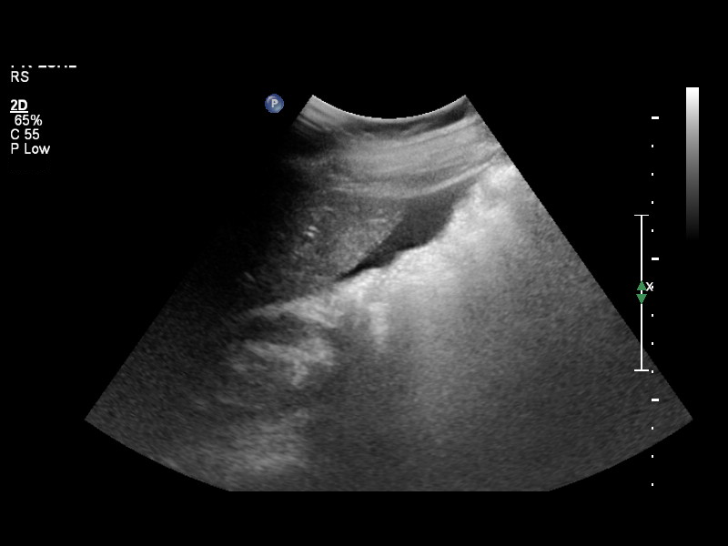
[im 20/20]
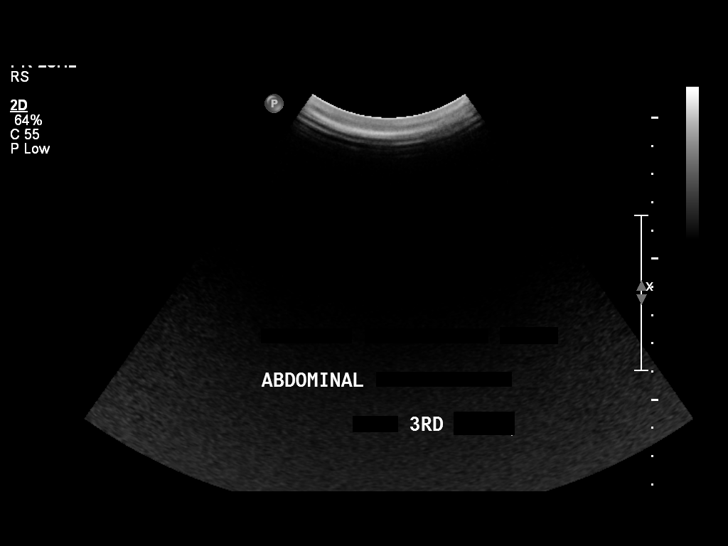

[14 of 20 positions shown; findings below may reference images not displayed]

FINDINGS: Small amount of fluid is seen around the liver.  Four-
quadrant assessment shows a tiny amount of fluid in the left upper
quadrant and both pericolic gutters.  No substantial free fluid is
seen in the lower quadrants.
IMPRESSION: Small volume of intraperitoneal free fluid evident.  Pelvic anatomy
not well visualized secondary to overlying obscuring bowel gas.  No
gross fluid can be identified.  If there is high clinical concern
for intraperitoneal bleeding, CT imaging would be a more sensitive
means to evaluate.

I discussed the results of this study with Dr. Tiger at

## 2014-05-04 IMAGING — US US RENAL
1 series · 14 of 25 positions shown · non-contrast
Comparison: No comparison studies available.

CLINICAL DATA: Gross hematuria after abdominal hysterectomy.

RENAL / URINARY TRACT ULTRASOUND
TECHNIQUE: Complete ultrasound exam of the kidneys and urinary
bladder was performed.

[Series 1: us renal · 14 of 34 slices shown]
[im 1/34]
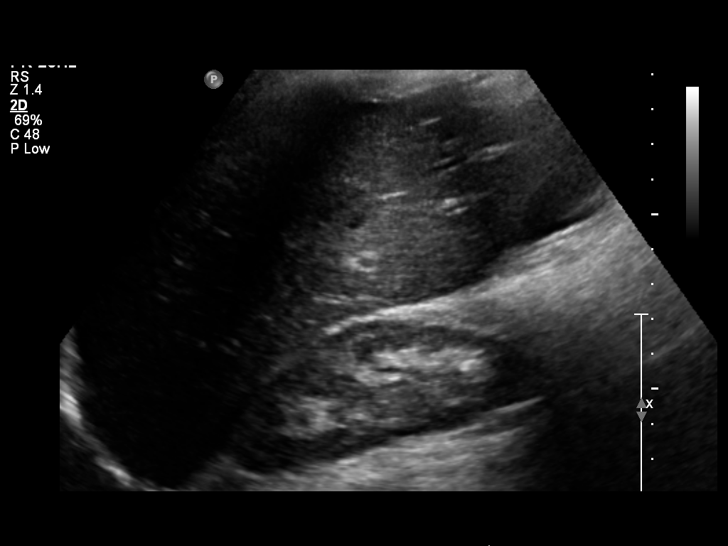
[im 3/34]
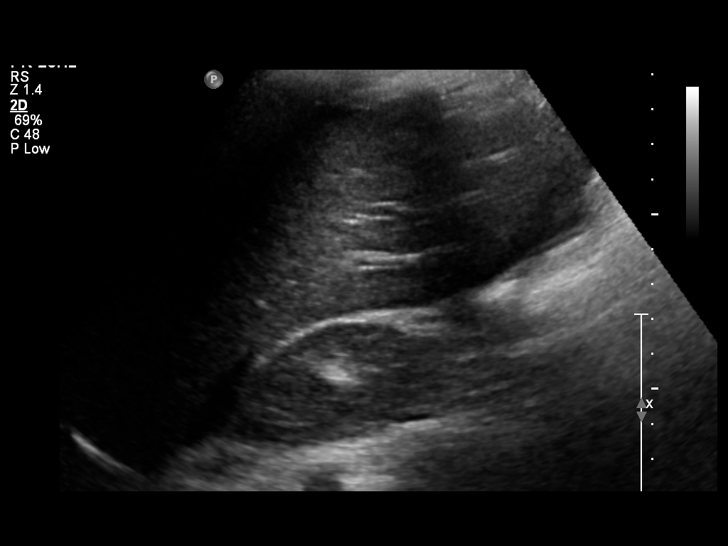
[im 6/34]
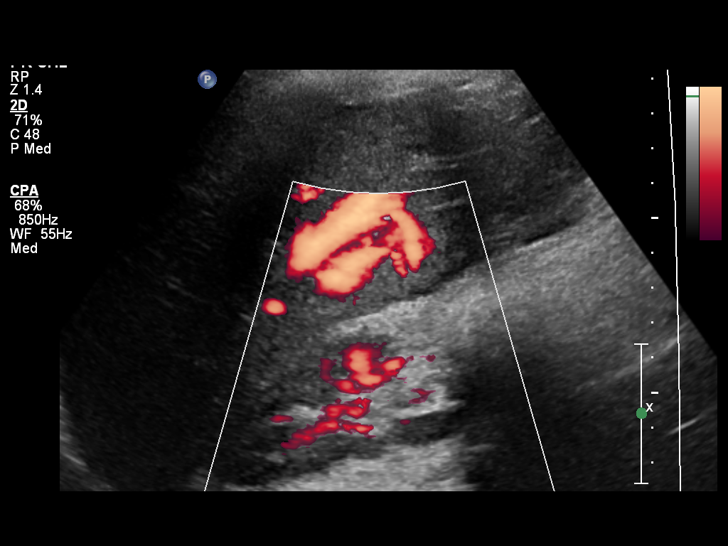
[im 9/34]
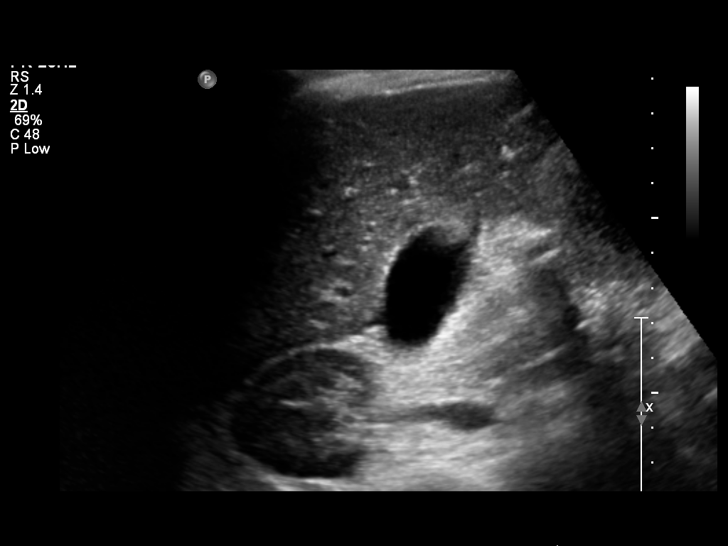
[im 12/34]
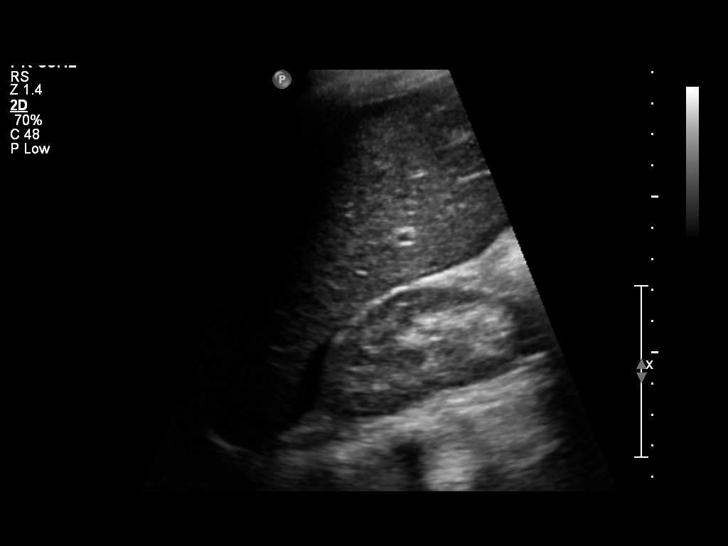
[im 13/34]
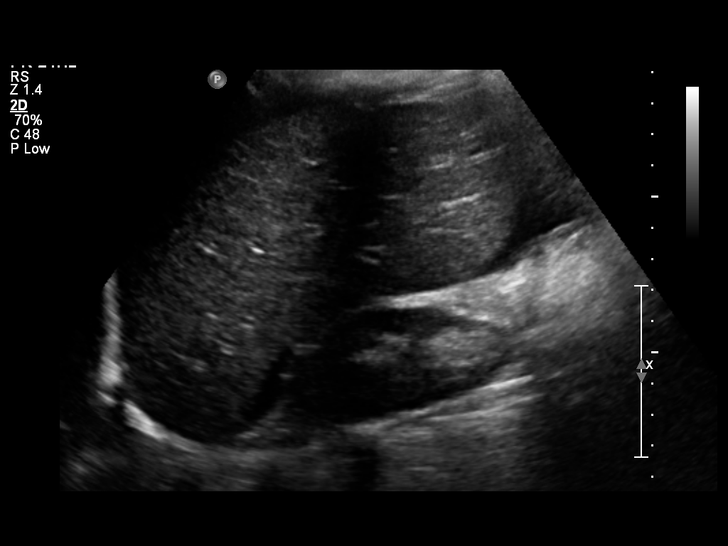
[im 16/34]
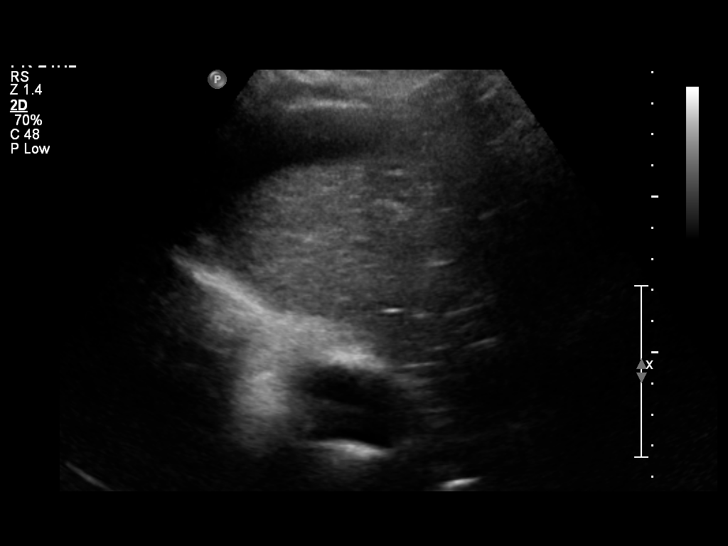
[im 18/34]
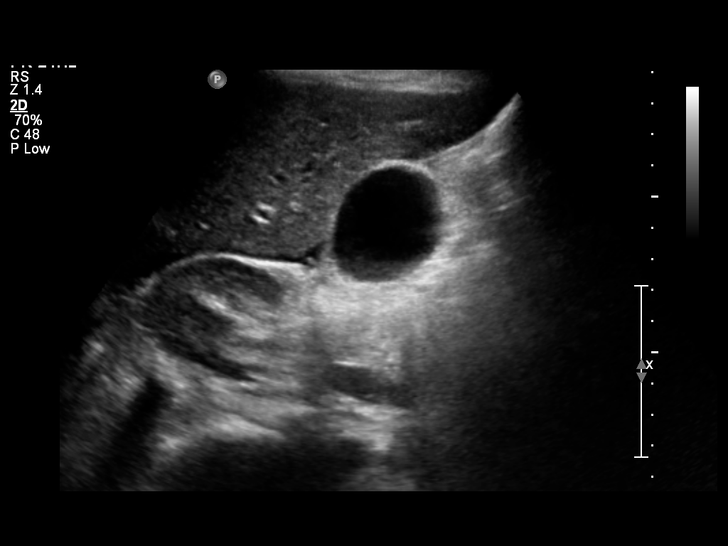
[im 21/34]
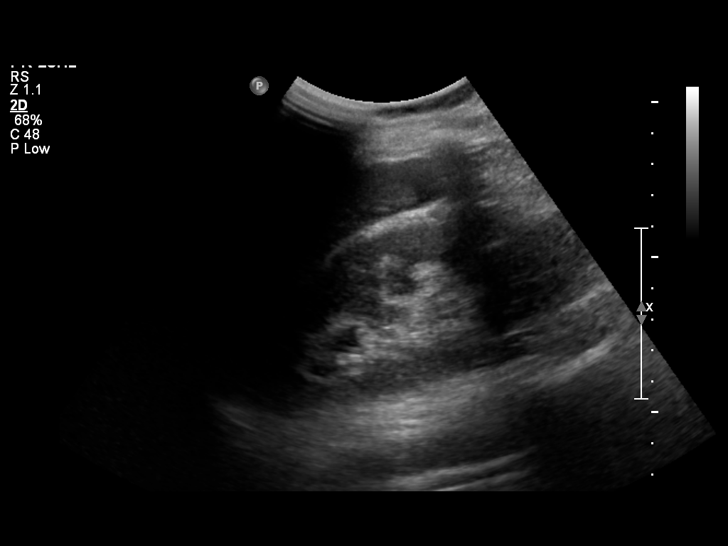
[im 23/34]
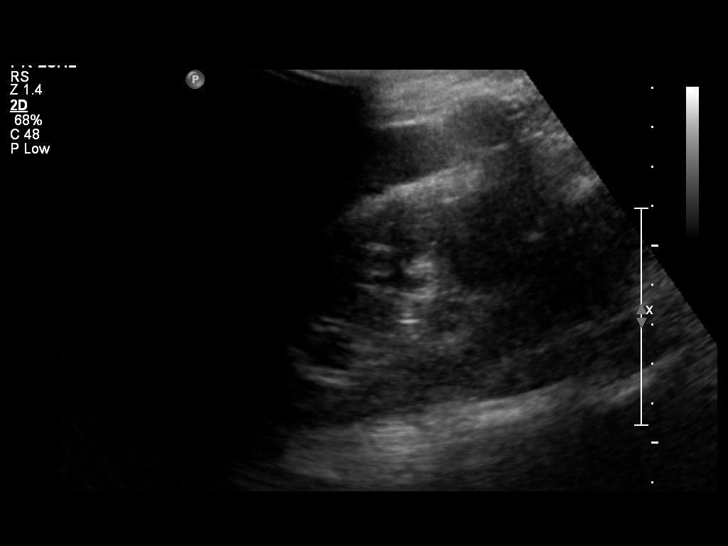
[im 25/34]
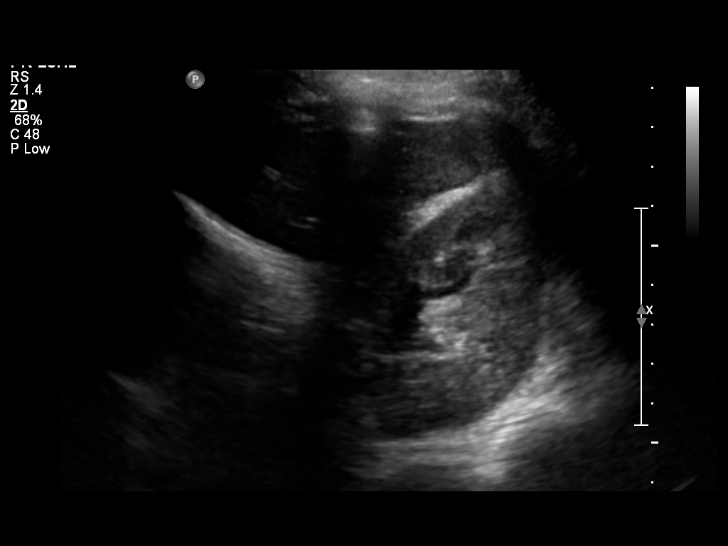
[im 28/34]
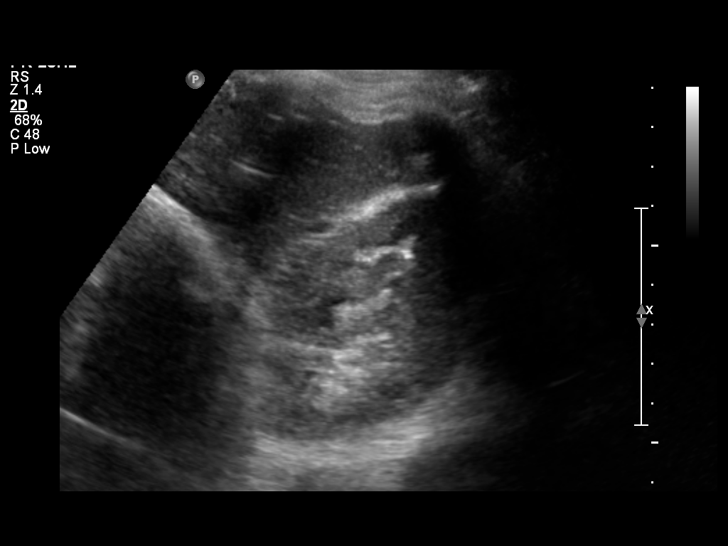
[im 31/34]
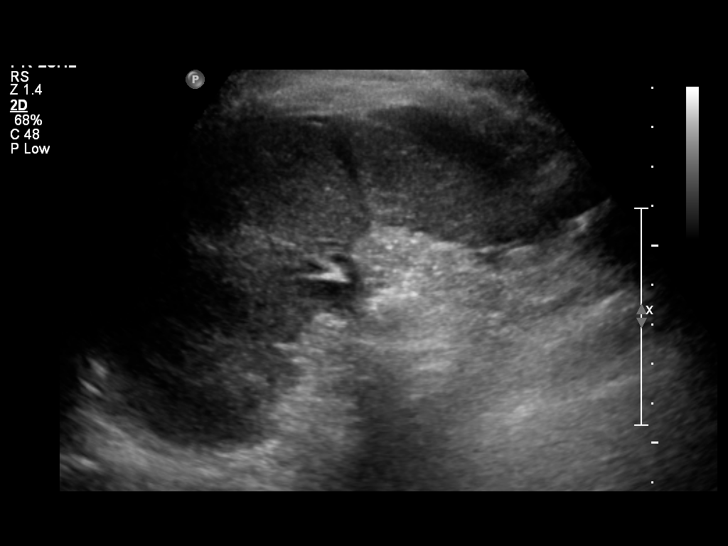
[im 34/34]
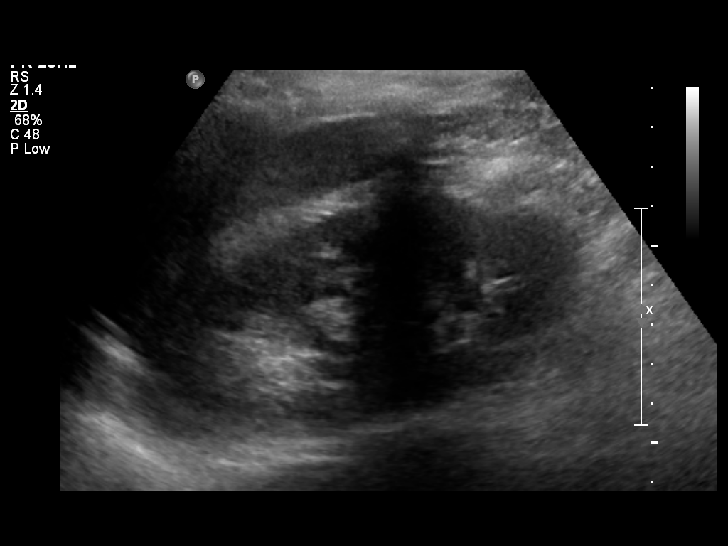

[14 of 25 positions shown; findings below may reference images not displayed]

FINDINGS: The right kidney measures 10.4 cm in long axis.  The left kidney
measures 11.70 cm.  The right kidney is sonographically normal.
Left kidney was technically difficult to visualize given the
patient immobility, but there is no overt hydronephrosis in the
left kidney.  There may be some mild fullness of the left
intrarenal collecting system.

Bladder is not visualized consistent with the presence of a Foley
catheter.  The patient is noted have a small amount of fluid around
the liver and spleen.
IMPRESSION: No evidence for hydronephrosis in either kidney.

Small volume of fluid is seen around the liver and spleen.

I discussed the this study by telephone with Dr.Paulus N at

## 2014-12-09 ENCOUNTER — Other Ambulatory Visit: Payer: Self-pay | Admitting: Obstetrics and Gynecology

## 2014-12-10 LAB — CYTOLOGY - PAP

## 2016-05-10 ENCOUNTER — Ambulatory Visit (INDEPENDENT_AMBULATORY_CARE_PROVIDER_SITE_OTHER): Payer: Managed Care, Other (non HMO) | Admitting: Podiatry

## 2016-05-10 ENCOUNTER — Encounter: Payer: Self-pay | Admitting: Podiatry

## 2016-05-10 VITALS — Ht 62.0 in | Wt 170.0 lb

## 2016-05-10 DIAGNOSIS — B353 Tinea pedis: Secondary | ICD-10-CM | POA: Diagnosis not present

## 2016-05-10 DIAGNOSIS — B351 Tinea unguium: Secondary | ICD-10-CM | POA: Diagnosis not present

## 2016-05-10 DIAGNOSIS — M79676 Pain in unspecified toe(s): Secondary | ICD-10-CM | POA: Diagnosis not present

## 2016-05-10 DIAGNOSIS — M79675 Pain in left toe(s): Secondary | ICD-10-CM

## 2016-05-10 MED ORDER — TERBINAFINE HCL 250 MG PO TABS: 250.0000 mg | ORAL_TABLET | Freq: Every day | ORAL | Status: DC

## 2016-05-10 NOTE — Patient Instructions (Signed)

## 2016-05-10 NOTE — Progress Notes (Signed)
   Subjective:    Patient ID: Ashley Morales, female    DOB: July 21, 1966, 50 y.o.   MRN: [id number]  HPI    Review of Systems  All other systems reviewed and are negative.      Objective:   Physical Exam        Assessment & Plan:

## 2016-05-13 NOTE — Progress Notes (Signed)
Patient ID: Ashley Morales, female   DOB: October 19, 1965, 50 y.o.   MRN: [id number] Subjective: Patient presents today for evaluation of bilateral fungus to the feet. The patient is concerned that perhaps she still may have some fungal nails bilaterally. No other complaints at this time   Objective: Physical Exam General: The patient is alert and oriented x3 in no acute distress.  Dermatology: Hyperkeratotic diffuse skin with xerosis is noted bilaterally to the weightbearing surfaces of the bilateral feet. Hallux nails are hyperkeratotic and dystrophic.  Skin is warm, dry bilateral lower extremities. Negative for open lesions or macerations.  Vascular: Palpable pedal pulses bilaterally. No edema or erythema noted. Capillary refill within normal limits.  Neurological: Epicritic and protective threshold grossly intact bilaterally.   Musculoskeletal Exam: Range of motion within normal limits to all pedal and ankle joints bilateral. Muscle strength 5/5 in all groups bilateral.    Assessment: #1 onychomycosis bilateral great toenails. #2 dermatophytosis of nails #3 moccasin-type tinea pedis bilateral feet. #4. puritis bilateral feet. #5 onycholysis left hallux nail.  Problem List Items Addressed This Visit    None    Visit Diagnoses    Tinea pedis due to epidermophyton    -  Primary   Relevant Medications   terbinafine (LAMISIL) tablet 250 mg   Onychomycosis due to dermatophyte       Relevant Medications   terbinafine (LAMISIL) tablet 250 mg   Other Relevant Orders   Culture, fungus without smear        Plan of Care:  #1 Patient was evaluated. #2 Fungal culture and no biopsy was taken of the left hallux nail and sent to pathology. #3 prescription for 250 mg Lamisil prescribed to the patient for 4 weeks. No need for liver function tests due to the short duration of the antifungal medication. #4 patient is to return to clinic in 4 weeks.     Dr. Edrick Kins,  [place]

## 2016-05-15 ENCOUNTER — Other Ambulatory Visit: Payer: Self-pay

## 2016-05-15 MED ORDER — TERBINAFINE HCL 250 MG PO TABS: 250.0000 mg | ORAL_TABLET | Freq: Every day | ORAL | 0 refills | Status: DC

## 2016-06-07 ENCOUNTER — Encounter: Payer: Self-pay | Admitting: Podiatry

## 2016-06-07 ENCOUNTER — Ambulatory Visit (INDEPENDENT_AMBULATORY_CARE_PROVIDER_SITE_OTHER): Payer: Managed Care, Other (non HMO) | Admitting: Podiatry

## 2016-06-07 DIAGNOSIS — M79675 Pain in left toe(s): Secondary | ICD-10-CM | POA: Diagnosis not present

## 2016-06-07 DIAGNOSIS — B353 Tinea pedis: Secondary | ICD-10-CM

## 2016-06-07 DIAGNOSIS — B351 Tinea unguium: Secondary | ICD-10-CM

## 2016-06-07 DIAGNOSIS — Z79899 Other long term (current) drug therapy: Secondary | ICD-10-CM

## 2016-06-07 MED ORDER — NONFORMULARY OR COMPOUNDED ITEM: 1.0000 [drp] | Freq: Every day | 2 refills | Status: DC

## 2016-06-08 MED ORDER — TERBINAFINE HCL 250 MG PO TABS: 250.0000 mg | ORAL_TABLET | Freq: Every day | ORAL | 1 refills | Status: DC

## 2016-06-08 NOTE — Progress Notes (Signed)
Subjective: Patient presents today for possible treatment and evaluation of fungal nails bilaterally 1 through 5. Patient has been started on Lamisil for 4 weeks for tinea pedis bilaterally. Today patient presents for fungal culture review and to proceed with fungal nail treatment   Objective: Physical Exam General: The patient is alert and oriented x3 in no acute distress.  Dermatology: Hyperkeratotic, discolored, thickened, onychodystrophy of nails noted bilaterally.  Skin is warm, dry and supple bilateral lower extremities. Negative for open lesions or macerations.  Vascular: Palpable pedal pulses bilaterally. No edema or erythema noted. Capillary refill within normal limits.  Neurological: Epicritic and protective threshold grossly intact bilaterally.   Musculoskeletal Exam: Range of motion within normal limits to all pedal and ankle joints bilateral. Muscle strength 5/5 in all groups bilateral.   Assessment: #1 onychodystrophy bilateral toenails #2 possible onychomycosis #3 hyperkeratotic nails bilateral  Plan of Care:  #1 Patient was evaluated. Fungal results were reviewed which are positive for fungus. #2 Orders for liver function tests were ordered today.  #3 prescription for antifungal nail lacquer was dispensed through [facility]  #4 patient is going to proceed with an additional 8 weeks of oral Lamisil therapy as well as laser therapy with Janett Billow. 5 patient is to return to the clinic for an appointment with Janett Billow as soon as possible, and return to the clinic to see me in 3 months  Dr. Edrick Kins, [place]

## 2016-06-20 ENCOUNTER — Other Ambulatory Visit: Payer: Managed Care, Other (non HMO)

## 2016-06-20 LAB — HEPATIC FUNCTION PANEL
ALBUMIN: 3.7 g/dL (ref 3.6–5.1)
ALK PHOS: 102 U/L (ref 33–115)
ALT: 10 U/L (ref 6–29)
AST: 15 U/L (ref 10–35)
BILIRUBIN TOTAL: 0.3 mg/dL (ref 0.2–1.2)
Bilirubin, Direct: 0.1 mg/dL (ref <=0.2)
Indirect Bilirubin: 0.2 mg/dL (ref 0.2–1.2)
TOTAL PROTEIN: 6.9 g/dL (ref 6.1–8.1)

## 2016-06-21 ENCOUNTER — Ambulatory Visit (INDEPENDENT_AMBULATORY_CARE_PROVIDER_SITE_OTHER): Payer: Managed Care, Other (non HMO) | Admitting: Podiatry

## 2016-06-21 DIAGNOSIS — B353 Tinea pedis: Secondary | ICD-10-CM

## 2016-06-21 DIAGNOSIS — B351 Tinea unguium: Secondary | ICD-10-CM

## 2016-06-21 DIAGNOSIS — M79675 Pain in left toe(s): Secondary | ICD-10-CM

## 2016-06-21 DIAGNOSIS — Z79899 Other long term (current) drug therapy: Secondary | ICD-10-CM

## 2016-06-24 NOTE — Progress Notes (Signed)
Subjective: Patient presents today for follow-up evaluation of lab results for liver function tests were normal  Assessment: Onychomycosis nails 1 through 5 bilateral  Plan of care: Today lab results were reviewed which were normal. Patient is to continue with the antifungal medication and scheduled laser therapy.  No charge for office visit

## 2016-06-27 ENCOUNTER — Ambulatory Visit (INDEPENDENT_AMBULATORY_CARE_PROVIDER_SITE_OTHER): Payer: Managed Care, Other (non HMO)

## 2016-06-27 DIAGNOSIS — B351 Tinea unguium: Secondary | ICD-10-CM

## 2016-06-27 NOTE — Progress Notes (Signed)
Pt presents for laser therapy to Lt hallux nail. Nail was very sensitive when attempting to treat, d/c treatment at this time. Will discuss this with Dr Amalia Hailey and formulate a treatment plan regarding Laser therapy for future

## 2016-06-28 ENCOUNTER — Telehealth: Payer: Self-pay

## 2016-06-28 NOTE — Telephone Encounter (Signed)
Spoke with pt regarding laser therapy, she was sensitive to the laser treatment that was attempted a few days ago, advise her that her nail could be sensitive due to losing it recently and we can retry the laser in one month

## 2016-09-18 ENCOUNTER — Ambulatory Visit (INDEPENDENT_AMBULATORY_CARE_PROVIDER_SITE_OTHER): Payer: Managed Care, Other (non HMO) | Admitting: Podiatry

## 2016-09-18 DIAGNOSIS — B351 Tinea unguium: Secondary | ICD-10-CM | POA: Diagnosis not present

## 2016-09-18 DIAGNOSIS — L608 Other nail disorders: Secondary | ICD-10-CM | POA: Diagnosis not present

## 2016-09-18 DIAGNOSIS — M79609 Pain in unspecified limb: Secondary | ICD-10-CM

## 2016-09-18 DIAGNOSIS — L603 Nail dystrophy: Secondary | ICD-10-CM

## 2016-09-22 NOTE — Progress Notes (Signed)
   Subjective: Patient presents today for follow-up treatment and evaluation of fungal nails 1-5 bilateral. Patient was last seen on 06/21/2016. Patient states that they have not completed the oral antifungal medication. Patient has also not been applying daily antifungal nail lacquer through [employer]. Patient presents today for further treatment and evaluation.  Objective: Physical Exam General: The patient is alert and oriented x3 in no acute distress.  Dermatology: Hyperkeratotic, discolored, thickened, onychodystrophy of nails noted bilaterally.  Skin is warm, dry and supple bilateral lower extremities. Negative for open lesions or macerations.  Vascular: Palpable pedal pulses bilaterally. No edema or erythema noted. Capillary refill within normal limits.  Neurological: Epicritic and protective threshold grossly intact bilaterally.   Musculoskeletal Exam: Range of motion within normal limits to all pedal and ankle joints bilateral. Muscle strength 5/5 in all groups bilateral.   Assessment: #1 onychodystrophy bilateral toenails #2 onychomycosis #3 hyperkeratotic nails bilateral  Plan of Care:  #1 Patient was evaluated. #2 reinforced with the patient that she does need to take a daily antifungal medication for to be effective. Also recommend daily application of antifungal nail lacquer. Continue laser therapy. Return to clinic in 6 months.  Edrick Kins, DPM [facility]  Dr. Edrick Kins, [place] [address]                                        [place], [place] [address]                Office [phone]  Fax [phone]

## 2017-03-19 ENCOUNTER — Ambulatory Visit (INDEPENDENT_AMBULATORY_CARE_PROVIDER_SITE_OTHER): Payer: Managed Care, Other (non HMO) | Admitting: Podiatry

## 2017-03-19 DIAGNOSIS — B351 Tinea unguium: Secondary | ICD-10-CM | POA: Diagnosis not present

## 2017-03-26 NOTE — Progress Notes (Signed)
   Subjective: Patient presents today for follow-up treatment and evaluation of fungal nails to the left great toe. Patient believes that left great toenail is looking better. She took the Lamisil is continuing to use antifungal nail lacquer. Patient presents today for further treatment and evaluation.  Objective: Physical Exam General: The patient is alert and oriented x3 in no acute distress.  Dermatology: Hyperkeratotic, discolored, thickened, onychodystrophy of nails to the left great toe.  Skin is warm, dry and supple bilateral lower extremities. Negative for open lesions or macerations.  Vascular: Palpable pedal pulses bilaterally. No edema or erythema noted. Capillary refill within normal limits.  Neurological: Epicritic and protective threshold grossly intact bilaterally.   Musculoskeletal Exam: Range of motion within normal limits to all pedal and ankle joints bilateral. Muscle strength 5/5 in all groups bilateral.   Assessment: #1 onychomycosis left great toe  Plan of Care:  #1 Patient was evaluated. #2 continue topical antifungal medication. The patient is not satisfied by the fall she can call and we will order another 90 days of Lamisil 250 mg a new liver function tests. Also discussed with the patient different treatment modalities including laser treatment. #3 return to clinic when necessary  Edrick Kins, DPM [facility]  Dr. Edrick Kins, [address]                                        [place], [place] [address]                Office [phone]  Fax [phone]

## 2019-11-14 ENCOUNTER — Ambulatory Visit: Payer: Managed Care, Other (non HMO) | Attending: Internal Medicine

## 2019-11-14 DIAGNOSIS — Z23 Encounter for immunization: Secondary | ICD-10-CM

## 2019-11-14 NOTE — Progress Notes (Signed)
   Covid-19 Vaccination Clinic  Name:  Ashley Morales    MRN: [id number] DOB: 1966/04/24  11/14/2019  [name] was observed post Covid-19 immunization for 15 minutes without incident. She was provided with Vaccine Information Sheet and instruction to access the V-Safe system.   [name] was instructed to call 911 with any severe reactions post vaccine: Marland Kitchen Difficulty breathing  . Swelling of face and throat  . A fast heartbeat  . A bad rash all over body  . Dizziness and weakness   Immunizations Administered    Name Date Dose VIS Date Route   Pfizer COVID-19 Vaccine 11/14/2019  9:14 AM 0.3 mL 08/15/2019 Intramuscular   Manufacturer: [place]   Lot: [id number]   [place]: [id number]

## 2019-12-08 ENCOUNTER — Ambulatory Visit: Payer: Managed Care, Other (non HMO) | Attending: Internal Medicine

## 2019-12-08 DIAGNOSIS — Z23 Encounter for immunization: Secondary | ICD-10-CM

## 2019-12-08 NOTE — Progress Notes (Signed)
   Covid-19 Vaccination Clinic  Name:  Ashley Morales    MRN: [id number] DOB: Apr 09, 1966  12/08/2019  Ms. Kucinski was observed post Covid-19 immunization for 15 minutes without incident. She was provided with Vaccine Information Sheet and instruction to access the V-Safe system.   Ms. Wissel was instructed to call 911 with any severe reactions post vaccine: Marland Kitchen Difficulty breathing  . Swelling of face and throat  . A fast heartbeat  . A bad rash all over body  . Dizziness and weakness   Immunizations Administered    Name Date Dose VIS Date Route   Pfizer COVID-19 Vaccine 12/08/2019 11:47 AM 0.3 mL 08/15/2019 Intramuscular   Manufacturer: [place]   Lot: [id number]   [place]: [id number]

## 2020-03-30 DIAGNOSIS — J302 Other seasonal allergic rhinitis: Secondary | ICD-10-CM | POA: Insufficient documentation

## 2022-01-09 ENCOUNTER — Ambulatory Visit: Payer: No Typology Code available for payment source | Admitting: Podiatry

## 2022-01-09 ENCOUNTER — Ambulatory Visit (INDEPENDENT_AMBULATORY_CARE_PROVIDER_SITE_OTHER): Payer: No Typology Code available for payment source

## 2022-01-09 ENCOUNTER — Encounter: Payer: Self-pay | Admitting: Podiatry

## 2022-01-09 DIAGNOSIS — K5731 Diverticulosis of large intestine without perforation or abscess with bleeding: Secondary | ICD-10-CM | POA: Insufficient documentation

## 2022-01-09 DIAGNOSIS — E785 Hyperlipidemia, unspecified: Secondary | ICD-10-CM | POA: Insufficient documentation

## 2022-01-09 DIAGNOSIS — Z6832 Body mass index (BMI) 32.0-32.9, adult: Secondary | ICD-10-CM | POA: Insufficient documentation

## 2022-01-09 DIAGNOSIS — I10 Essential (primary) hypertension: Secondary | ICD-10-CM | POA: Insufficient documentation

## 2022-01-09 DIAGNOSIS — M79609 Pain in unspecified limb: Secondary | ICD-10-CM | POA: Insufficient documentation

## 2022-01-09 DIAGNOSIS — G5791 Unspecified mononeuropathy of right lower limb: Secondary | ICD-10-CM

## 2022-01-09 DIAGNOSIS — F419 Anxiety disorder, unspecified: Secondary | ICD-10-CM | POA: Insufficient documentation

## 2022-01-09 DIAGNOSIS — E559 Vitamin D deficiency, unspecified: Secondary | ICD-10-CM | POA: Insufficient documentation

## 2022-01-09 DIAGNOSIS — M778 Other enthesopathies, not elsewhere classified: Secondary | ICD-10-CM | POA: Diagnosis not present

## 2022-01-09 DIAGNOSIS — R6882 Decreased libido: Secondary | ICD-10-CM | POA: Insufficient documentation

## 2022-01-09 DIAGNOSIS — J301 Allergic rhinitis due to pollen: Secondary | ICD-10-CM | POA: Insufficient documentation

## 2022-01-09 NOTE — Progress Notes (Signed)
? ?  HPI: 56 y.o. female presenting today as a reestablish new patient for evaluation of intermittent right heel pain.  Patient states that for the last few months she experiences sharp shooting pains that come and go within a matter of a few minutes.  She does admit to walking barefoot around the house. ?Patient also states that she has been experiencing " tightness" in her left forefoot.  She is being managed for hypertension and lymphedema by her PCP. ? ?Past Medical History:  ?Diagnosis Date  ? Anxiety   ? ? ?Past Surgical History:  ?Procedure Laterality Date  ? ABDOMINAL HYSTERECTOMY N/A 01/08/2013  ? Procedure: HYSTERECTOMY ABDOMINAL;  Surgeon: Cyril Mourning, MD;  Location: [facility];  Service: Gynecology;  Laterality: N/A;  ? BUNIONECTOMY Left   ? CESAREAN SECTION    ? x2  ? DILATION AND CURETTAGE OF UTERUS    ? ? ?No Known Allergies ?  ?Physical Exam: ?General: The patient is alert and oriented x3 in no acute distress. ? ?Dermatology: Skin is warm, dry and supple bilateral lower extremities. Negative for open lesions or macerations. ? ?Vascular: Palpable pedal pulses bilaterally. Capillary refill within normal limits.  Negative for any significant edema or erythema ? ?Neurological: Light touch and protective threshold grossly intact ? ?Musculoskeletal Exam: No pedal deformities noted.  No tenderness or pain with palpation throughout the bilateral feet.  Patient states that the left forefoot feels "tight" with range of motion.  No heel pain specifically to the right plantar heel as well ? ?Radiographic Exam:  ?Normal osseous mineralization. Joint spaces preserved. No fracture/dislocation/boney destruction.   ? ?Assessment: ?1.  Neuritis right heel; improving ?2.  Generalized foot pain/tightness left forefoot ? ? ?Plan of Care:  ?1. Patient evaluated. X-Rays reviewed.  ?2.  Patient states that over the last few weeks/month the right heel pain has slowly improved.  We will simply observe for now.  It is very  random and intermittent and I believe has a neuritis type etiology.  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  Continue lymphedema massage as needed and management with her PCP ?4.  Advised against going barefoot.   ?5.  Return to clinic as needed ? ?  ?  ?Edrick Kins, DPM ?[place] ? ?Dr. Edrick Kins, DPM  ?  ?[address]                                        ?[place], [place] [address]                ?Office [phone]  ?Fax [phone] ? ? ? ? ?

## 2022-04-19 ENCOUNTER — Other Ambulatory Visit: Payer: Self-pay | Admitting: Obstetrics and Gynecology

## 2022-04-19 DIAGNOSIS — R928 Other abnormal and inconclusive findings on diagnostic imaging of breast: Secondary | ICD-10-CM

## 2022-05-12 ENCOUNTER — Other Ambulatory Visit: Payer: Self-pay | Admitting: Obstetrics and Gynecology

## 2022-05-12 ENCOUNTER — Ambulatory Visit
Admission: RE | Admit: 2022-05-12 | Discharge: 2022-05-12 | Disposition: A | Discharge status: Home / Self Care | Payer: Self-pay | Source: Ambulatory Visit | Attending: Obstetrics and Gynecology | Admitting: Obstetrics and Gynecology

## 2022-05-12 ENCOUNTER — Ambulatory Visit
Admission: RE | Admit: 2022-05-12 | Discharge: 2022-05-12 | Disposition: A | Discharge status: Home / Self Care | Payer: No Typology Code available for payment source | Source: Ambulatory Visit | Attending: Obstetrics and Gynecology | Admitting: Obstetrics and Gynecology

## 2022-05-12 DIAGNOSIS — N6310 Unspecified lump in the right breast, unspecified quadrant: Secondary | ICD-10-CM

## 2022-05-12 DIAGNOSIS — R928 Other abnormal and inconclusive findings on diagnostic imaging of breast: Secondary | ICD-10-CM

## 2022-05-26 ENCOUNTER — Ambulatory Visit
Admission: RE | Admit: 2022-05-26 | Discharge: 2022-05-26 | Disposition: A | Discharge status: Home / Self Care | Payer: No Typology Code available for payment source | Source: Ambulatory Visit | Attending: Obstetrics and Gynecology | Admitting: Obstetrics and Gynecology

## 2022-05-26 DIAGNOSIS — N6310 Unspecified lump in the right breast, unspecified quadrant: Secondary | ICD-10-CM

## 2023-05-10 ENCOUNTER — Other Ambulatory Visit: Payer: Self-pay | Admitting: Obstetrics and Gynecology

## 2023-05-10 DIAGNOSIS — R928 Other abnormal and inconclusive findings on diagnostic imaging of breast: Secondary | ICD-10-CM

## 2023-06-06 ENCOUNTER — Ambulatory Visit
Admission: RE | Admit: 2023-06-06 | Discharge: 2023-06-06 | Disposition: A | Discharge status: Home / Self Care | Payer: No Typology Code available for payment source | Source: Ambulatory Visit | Attending: Obstetrics and Gynecology | Admitting: Obstetrics and Gynecology

## 2023-06-06 DIAGNOSIS — R928 Other abnormal and inconclusive findings on diagnostic imaging of breast: Secondary | ICD-10-CM

## 2023-06-07 ENCOUNTER — Other Ambulatory Visit: Payer: No Typology Code available for payment source
